# Patient Record
Sex: Male | Born: 1985 | Race: Black or African American | Hispanic: No | Marital: Single | Smoking: Current every day smoker
Health system: Southern US, Community
[De-identification: ages and names within clinical notes are randomized; demographics above are authoritative.]

## PROBLEM LIST (undated history)

## (undated) DIAGNOSIS — F419 Anxiety disorder, unspecified: Secondary | ICD-10-CM

## (undated) HISTORY — DX: Anxiety disorder, unspecified: F41.9

## (undated) HISTORY — PX: NOSE SURGERY: SHX723

---
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
## 2016-11-23 ENCOUNTER — Encounter (HOSPITAL_COMMUNITY): Payer: Self-pay

## 2016-11-23 ENCOUNTER — Emergency Department (HOSPITAL_COMMUNITY)
Admission: EM | Admit: 2016-11-23 | Discharge: 2016-11-23 | Disposition: A | Discharge status: Home / Self Care | Payer: Medicaid Other | Attending: Emergency Medicine | Admitting: Emergency Medicine

## 2016-11-23 DIAGNOSIS — Y929 Unspecified place or not applicable: Secondary | ICD-10-CM | POA: Diagnosis not present

## 2016-11-23 DIAGNOSIS — X58XXXA Exposure to other specified factors, initial encounter: Secondary | ICD-10-CM | POA: Diagnosis not present

## 2016-11-23 DIAGNOSIS — Y939 Activity, unspecified: Secondary | ICD-10-CM | POA: Diagnosis not present

## 2016-11-23 DIAGNOSIS — Y999 Unspecified external cause status: Secondary | ICD-10-CM | POA: Diagnosis not present

## 2016-11-23 DIAGNOSIS — F17210 Nicotine dependence, cigarettes, uncomplicated: Secondary | ICD-10-CM | POA: Insufficient documentation

## 2016-11-23 DIAGNOSIS — K0889 Other specified disorders of teeth and supporting structures: Secondary | ICD-10-CM

## 2016-11-23 DIAGNOSIS — S0993XA Unspecified injury of face, initial encounter: Secondary | ICD-10-CM | POA: Diagnosis present

## 2016-11-23 DIAGNOSIS — S025XXA Fracture of tooth (traumatic), initial encounter for closed fracture: Secondary | ICD-10-CM | POA: Insufficient documentation

## 2016-11-23 MED ORDER — IBUPROFEN 400 MG PO TABS: 400.0000 mg | ORAL_TABLET | Freq: Four times a day (QID) | ORAL | 0 refills | Status: DC | PRN

## 2016-11-23 MED ORDER — PENICILLIN V POTASSIUM 250 MG PO TABS
500.0000 mg | ORAL_TABLET | Freq: Four times a day (QID) | ORAL | Status: DC
  Administered 2016-11-23: 500 mg via ORAL
  Filled 2016-11-23: qty 2

## 2016-11-23 MED ORDER — KETOROLAC TROMETHAMINE 60 MG/2ML IM SOLN
60.0000 mg | Freq: Once | INTRAMUSCULAR | Status: AC
  Administered 2016-11-23: 60 mg via INTRAMUSCULAR
  Filled 2016-11-23: qty 2

## 2016-11-23 MED ORDER — PENICILLIN V POTASSIUM 500 MG PO TABS: 500.0000 mg | ORAL_TABLET | Freq: Three times a day (TID) | ORAL | 0 refills | Status: DC

## 2016-11-23 NOTE — ED Notes (Signed)
Pt ambulatory to waiting room. Pt verbalized understanding of discharge instructions.   

## 2016-11-23 NOTE — ED Provider Notes (Signed)
[facility]-EMERGENCY DEPT Provider Note   CSN: [id number] Arrival date & time: 11/23/16  2148   By signing my name below, I, Clarisse GougeXavier Herndon, attest that this documentation has been prepared under the direction and in the presence of Abdur Hoglund, Barbara CowerJason, MD. Electronically signed, Clarisse GougeXavier Herndon, ED Scribe. 11/23/16. 10:28 PM.   History   Chief Complaint Chief Complaint  Patient presents with  . Dental Pain   The history is provided by the patient, medical records and a friend. No language interpreter was used.    Evan Rice is a 31 y.o. male with h/o poor dentition who presents to the Emergency Department with concern for severe, constant, throbbing L upper dental pain all day today. Pain progressively worsened over the day with no radiation. He states he has had a cavity to the area x 2 years and been unable to get it repaired d/t financial constraints. No modifying factors noted. No treatments reported at home. No other complaints at this time.  History reviewed. No pertinent past medical history.  There are no active problems to display for this patient.   History reviewed. No pertinent surgical history.     Home Medications    Prior to Admission medications   Medication Sig Start Date End Date Taking? Authorizing Provider  ibuprofen (ADVIL,MOTRIN) 400 MG tablet Take 1 tablet (400 mg total) by mouth every 6 (six) hours as needed. 11/23/16   Bodey Frizell, Barbara CowerJason, MD  penicillin v potassium (VEETID) 500 MG tablet Take 1 tablet (500 mg total) by mouth 3 (three) times daily. 11/23/16   Ange Puskas, Barbara CowerJason, MD    Family History No family history on file.  Social History Social History  Substance Use Topics  . Smoking status: Current Every Day Smoker    Packs/day: 1.00    Types: Cigarettes  . Smokeless tobacco: Never Used  . Alcohol use Yes     Comment: (2) 40oz of beer weekly     Allergies   Patient has no known allergies.   Review of Systems Review of Systems All other systems  reviewed and all systems are negative for acute changes except as noted in the HPI and PMH.    Physical Exam Updated Vital Signs BP 138/70 (BP Location: Right Arm)   Pulse (!) 51   Temp 98.4 F (36.9 C) (Oral)   Resp 17   Ht 5\' 6"  (1.676 m)   Wt 220 lb (99.8 kg)   SpO2 97%   BMI 35.51 kg/m   Physical Exam  Constitutional: He is oriented to person, place, and time. He appears well-developed and well-nourished.  HENT:  Head: Normocephalic.  Mouth/Throat: Oropharynx is clear and moist.  Broken tooth L upper canine, erythema and swelling surrounding; no sublingual tenderness or fullness  Eyes: EOM are normal.  Neck: Normal range of motion.  Pulmonary/Chest: Effort normal. No respiratory distress.  Abdominal: He exhibits no distension.  Musculoskeletal: Normal range of motion.  Neurological: He is alert and oriented to person, place, and time.  Skin: No rash noted. No erythema.  Psychiatric: He has a normal mood and affect.  Nursing note and vitals reviewed.    ED Treatments / Results  DIAGNOSTIC STUDIES: Oxygen Saturation is 97% on RA, NL by my interpretation.    COORDINATION OF CARE: 2:57 PM-Discussed next steps with pt. Pt verbalized understanding and is agreeable with the plan. Will order medications. Pt prepared for d/c, advised of symptomatic care at home, F/U instructions and return precautions.    Labs (all labs  ordered are listed, but only abnormal results are displayed) Labs Reviewed - No data to display  EKG  EKG Interpretation None       Radiology No results found.  Procedures Procedures (including critical care time)  Medications Ordered in ED Medications  ketorolac (TORADOL) injection 60 mg (60 mg Intramuscular Given 11/23/16 2246)     Initial Impression / Assessment and Plan / ED Course  I have reviewed the triage vital signs and the nursing notes.  Pertinent labs & imaging results that were available during my care of the patient were  reviewed by me and considered in my medical decision making (see chart for details).     Dental pain. Mild infection. abx/nsaids given. No e/o ludwigs or other deep space infection or systemic involvement.  Needs dental follow up which he is working on.   Final Clinical Impressions(s) / ED Diagnoses   Final diagnoses:  Closed fracture of tooth, initial encounter  Dentalgia    New Prescriptions Discharge Medication List as of 11/23/2016 10:28 PM    START taking these medications   Details  ibuprofen (ADVIL,MOTRIN) 400 MG tablet Take 1 tablet (400 mg total) by mouth every 6 (six) hours as needed., Starting Tue 11/23/2016, Print    penicillin v potassium (VEETID) 500 MG tablet Take 1 tablet (500 mg total) by mouth 3 (three) times daily., Starting Tue 11/23/2016, Print      I personally performed the services described in this documentation, which was scribed in my presence. The recorded information has been reviewed and is accurate.     Marily Memos, MD 11/25/16 [phone]

## 2016-11-23 NOTE — ED Triage Notes (Signed)
Left upper toothache that started this evening.

## 2017-04-03 ENCOUNTER — Emergency Department (HOSPITAL_COMMUNITY)
Admission: EM | Admit: 2017-04-03 | Discharge: 2017-04-03 | Disposition: A | Discharge status: Home / Self Care | Payer: Medicaid Other | Attending: Emergency Medicine | Admitting: Emergency Medicine

## 2017-04-03 ENCOUNTER — Emergency Department (HOSPITAL_COMMUNITY): Payer: Medicaid Other

## 2017-04-03 ENCOUNTER — Encounter (HOSPITAL_COMMUNITY): Payer: Self-pay | Admitting: Emergency Medicine

## 2017-04-03 DIAGNOSIS — F17210 Nicotine dependence, cigarettes, uncomplicated: Secondary | ICD-10-CM | POA: Insufficient documentation

## 2017-04-03 DIAGNOSIS — R0789 Other chest pain: Secondary | ICD-10-CM | POA: Diagnosis not present

## 2017-04-03 DIAGNOSIS — R079 Chest pain, unspecified: Secondary | ICD-10-CM | POA: Diagnosis present

## 2017-04-03 LAB — CBC
HEMATOCRIT: 44.1 % (ref 39.0–52.0)
HEMOGLOBIN: 14.8 g/dL (ref 13.0–17.0)
MCH: 30.1 pg (ref 26.0–34.0)
MCHC: 33.6 g/dL (ref 30.0–36.0)
MCV: 89.6 fL (ref 78.0–100.0)
Platelets: 143 10*3/uL — ABNORMAL LOW (ref 150–400)
RBC: 4.92 MIL/uL (ref 4.22–5.81)
RDW: 13.6 % (ref 11.5–15.5)
WBC: 8.1 10*3/uL (ref 4.0–10.5)

## 2017-04-03 LAB — BASIC METABOLIC PANEL
ANION GAP: 11 (ref 5–15)
BUN: 8 mg/dL (ref 6–20)
CALCIUM: 8.7 mg/dL — AB (ref 8.9–10.3)
CO2: 21 mmol/L — AB (ref 22–32)
CREATININE: 0.89 mg/dL (ref 0.61–1.24)
Chloride: 107 mmol/L (ref 101–111)
GFR calc non Af Amer: >60 mL/min (ref >60)
Glucose, Bld: 91 mg/dL (ref 65–99)
Potassium: 4 mmol/L (ref 3.5–5.1)
SODIUM: 139 mmol/L (ref 135–145)

## 2017-04-03 LAB — TROPONIN I

## 2017-04-03 NOTE — ED Provider Notes (Signed)
[facility]-EMERGENCY DEPT Provider Note   CSN: [id number] Arrival date & time: 04/03/17  0151     History   Chief Complaint Chief Complaint  Patient presents with  . Chest Pain    HPI Evan Rice is a 31 y.o. male.  HPI Patient is a 31 year old male who presents the emergency department with complaints of chest pressure with some shortness of breath.  This resolved for EMS.  No symptoms at this time.  He states he is intermittently had these symptoms for months and is worse at nighttime when he lays flat.  No history of early cardiac disease in his family.  No prior history of heart disease himself.  He is currently unemployed.  He states he spends most of his day sitting around the house watching TV.  He does not exercise.  Denies abdominal pain.  No back pain.  No radiation of his discomfort to his jaw or neck.  No diaphoresis.  No nausea or vomiting.  Symptoms have resolved    History reviewed. No pertinent past medical history.  There are no active problems to display for this patient.   History reviewed. No pertinent surgical history.     Home Medications    Prior to Admission medications   Medication Sig Start Date End Date Taking? Authorizing Provider  ibuprofen (ADVIL,MOTRIN) 400 MG tablet Take 1 tablet (400 mg total) by mouth every 6 (six) hours as needed. 11/23/16   Mesner, Barbara Cower, MD  penicillin v potassium (VEETID) 500 MG tablet Take 1 tablet (500 mg total) by mouth 3 (three) times daily. 11/23/16   Mesner, Barbara Cower, MD    Family History No family history on file.  Social History Social History  Substance Use Topics  . Smoking status: Current Every Day Smoker    Packs/day: 1.00    Types: Cigarettes  . Smokeless tobacco: Never Used  . Alcohol use Yes     Comment: (2) 40oz of beer weekly     Allergies   Patient has no known allergies.   Review of Systems Review of Systems  All other systems reviewed and are negative.    Physical Exam Updated Vital  Signs BP (!) 115/91   Pulse (!) 48   Resp (!) 21   Ht  (1.676 m)   Wt 82.6 kg (182 lb)   SpO2 96%   BMI 29.38 kg/m   Physical Exam  Constitutional: He is oriented to person, place, and time. He appears well-developed and well-nourished.  HENT:  Head: Normocephalic and atraumatic.  Eyes: EOM are normal.  Neck: Normal range of motion.  Cardiovascular: Normal rate, regular rhythm and normal heart sounds.   Pulmonary/Chest: Effort normal and breath sounds normal. No respiratory distress.  Abdominal: Soft. He exhibits no distension. There is no tenderness.  Musculoskeletal: Normal range of motion.  Neurological: He is alert and oriented to person, place, and time.  Skin: Skin is warm and dry.  Psychiatric: He has a normal mood and affect. Judgment normal.  Nursing note and vitals reviewed.    ED Treatments / Results  Labs (all labs ordered are listed, but only abnormal results are displayed) Labs Reviewed  CBC - Abnormal; Notable for the following:       Result Value   Platelets 143 (*)    All other components within normal limits  BASIC METABOLIC PANEL - Abnormal; Notable for the following:    CO2 21 (*)    Calcium 8.7 (*)    All other  components within normal limits  TROPONIN I    EKG  EKG Interpretation  Date/Time:  Sunday April 03 2017 01:54:44 EDT Ventricular Rate:  46 PR Interval:    QRS Duration: 101 QT Interval:  487 QTC Calculation: 426 R Axis:   61 Text Interpretation:  Sinus bradycardia Atrial premature complex No old tracing to compare Benign early repolarization Confirmed by Azalia Bilis ([id number]) on 04/03/2017 3:42:06 AM       Radiology Dg Chest 2 View  Result Date: 04/03/2017 CLINICAL DATA:  Shortness of breath and chest pain EXAM: CHEST  2 VIEW COMPARISON:  None. FINDINGS: Lungs are clear. Heart size and pulmonary vascularity are normal. No adenopathy. No pneumothorax. No bone lesions. IMPRESSION: No edema or consolidation. Electronically  Signed   By: Bretta Bang III M.D.   On: 04/03/2017 02:43    Procedures Procedures (including critical care time)  Medications Ordered in ED Medications - No data to display   Initial Impression / Assessment and Plan / ED Course  I have reviewed the triage vital signs and the nursing notes.  Pertinent labs & imaging results that were available during my care of the patient were reviewed by me and considered in my medical decision making (see chart for details).    Well-appearing.  Atypical chest pain.  Doubt PE.  PERC negative.  Doubt ACS.  Overall well-appearing.  EKG demonstrates early repolarization.  Primary care follow-up.  Patient understands return to the ER for new or worsening symptoms  Final Clinical Impressions(s) / ED Diagnoses   Final diagnoses:  Atypical chest pain    New Prescriptions New Prescriptions   No medications on file     Azalia Bilis, MD 04/03/17 [phone]

## 2017-04-03 NOTE — ED Triage Notes (Signed)
Brought by ems from home after having central chest pressure with SOB.  Given Ntg 0.4 X 1 en route with full relief of pain and sob.  Reports that this has happened several times before. Denies having any history.

## 2017-07-29 ENCOUNTER — Other Ambulatory Visit: Payer: Self-pay

## 2017-07-29 ENCOUNTER — Encounter (HOSPITAL_COMMUNITY): Payer: Self-pay | Admitting: Emergency Medicine

## 2017-07-29 ENCOUNTER — Emergency Department (HOSPITAL_COMMUNITY)
Admission: EM | Admit: 2017-07-29 | Discharge: 2017-07-29 | Disposition: A | Discharge status: Home / Self Care | Payer: Medicaid Other | Attending: Emergency Medicine | Admitting: Emergency Medicine

## 2017-07-29 DIAGNOSIS — F17210 Nicotine dependence, cigarettes, uncomplicated: Secondary | ICD-10-CM | POA: Insufficient documentation

## 2017-07-29 DIAGNOSIS — A630 Anogenital (venereal) warts: Secondary | ICD-10-CM | POA: Insufficient documentation

## 2017-07-29 DIAGNOSIS — R1909 Other intra-abdominal and pelvic swelling, mass and lump: Secondary | ICD-10-CM | POA: Diagnosis present

## 2017-07-29 MED ORDER — LIDOCAINE 5 % EX OINT: 1.0000 "application " | TOPICAL_OINTMENT | CUTANEOUS | 0 refills | Status: DC | PRN

## 2017-07-29 NOTE — Discharge Instructions (Signed)
Please be sure to schedule appointment with our urology colleagues.  Return here for concerning changes in your condition.

## 2017-07-29 NOTE — ED Triage Notes (Signed)
Testicular swelling and knot on testicle for 2-3 years   Getting bigger and now with bleeding and painful  Pt has never had it evaluated

## 2017-07-29 NOTE — ED Provider Notes (Signed)
[facility] EMERGENCY DEPARTMENT Provider Note   CSN: [id number] Arrival date & time: 07/29/17  1444     History   Chief Complaint Chief Complaint  Patient presents with  . Groin Swelling    for 2-3 years    HPI Evan Rice is a 32 y.o. male.  HPI  Presents due to concern of a painful lesion on the left side of his scrotum. Lesions been present for possibly 2 or 3 years, but over the past 2 days has become more irritated, slightly erythematous, with some bloody discharge. No other new systemic complaints including fever, chills, dysuria, hematuria. Patient has had no evaluation of this lesion thus far.   History reviewed. No pertinent past medical history.  There are no active problems to display for this patient.   History reviewed. No pertinent surgical history.     Home Medications    Prior to Admission medications   Medication Sig Start Date End Date Taking? Authorizing Provider  lidocaine (XYLOCAINE) 5 % ointment Apply 1 application topically as needed. 07/29/17   Gerhard Munch, MD    Family History History reviewed. No pertinent family history.  Social History Social History   Tobacco Use  . Smoking status: Current Every Day Smoker    Packs/day: 1.00    Types: Cigarettes  . Smokeless tobacco: Never Used  Substance Use Topics  . Alcohol use: Yes    Comment: (2) 40oz of beer weekly  . Drug use: No     Allergies   Patient has no known allergies.   Review of Systems Review of Systems  Constitutional: Negative for fever.  Respiratory: Negative for shortness of breath.   Cardiovascular: Negative for chest pain.  Musculoskeletal:       Negative aside from HPI  Skin:       Negative aside from HPI  Allergic/Immunologic: Negative for immunocompromised state.  Neurological: Negative for weakness.     Physical Exam Updated Vital Signs BP (!) 141/84 (BP Location: Right Arm)   Pulse 70   Temp 98.4 F (36.9 C) (Oral)   Resp 16    Ht 5\' 6"  (1.676 m)   Wt 82.6 kg (182 lb)   SpO2 99%   BMI 29.38 kg/m   Physical Exam  Constitutional: He is oriented to person, place, and time. He appears well-developed. No distress.  HENT:  Head: Normocephalic and atraumatic.  Eyes: Conjunctivae and EOM are normal.  Pulmonary/Chest: Effort normal. No stridor. No respiratory distress.  Abdominal: He exhibits no distension.  Genitourinary: Testes normal and penis normal. Circumcised.     Musculoskeletal: He exhibits no edema.  Neurological: He is alert and oriented to person, place, and time.  Skin: Skin is warm and dry.  Psychiatric: He has a normal mood and affect.  Nursing note and vitals reviewed.    ED Treatments / Results   Procedures Procedures (including critical care time)  Medications Ordered in ED Medications - No data to display   Initial Impression / Assessment and Plan / ED Course  I have reviewed the triage vital signs and the nursing notes.  Pertinent labs & imaging results that were available during my care of the patient were reviewed by me and considered in my medical decision making (see chart for details).  Generally well-appearing young male presents with concern of a new mass near his scrotum. Patient is awake and alert, no evidence for systemic disease. Findings consistent with condyloma. Absent other complaints, with reassuring vitals, patient will follow  up with urology.  Final Clinical Impressions(s) / ED Diagnoses   Final diagnoses:  Genital warts    ED Discharge Orders        Ordered    lidocaine (XYLOCAINE) 5 % ointment  As needed     07/29/17 1542       Gerhard MunchLockwood, Danniella Robben, MD 07/29/17 1545

## 2018-01-23 ENCOUNTER — Encounter (HOSPITAL_COMMUNITY): Payer: Self-pay | Admitting: *Deleted

## 2018-01-23 ENCOUNTER — Emergency Department (HOSPITAL_COMMUNITY)
Admission: EM | Admit: 2018-01-23 | Discharge: 2018-01-23 | Disposition: A | Discharge status: Home / Self Care | Payer: Medicaid Other | Attending: Emergency Medicine | Admitting: Emergency Medicine

## 2018-01-23 ENCOUNTER — Other Ambulatory Visit: Payer: Self-pay

## 2018-01-23 DIAGNOSIS — Z5321 Procedure and treatment not carried out due to patient leaving prior to being seen by health care provider: Secondary | ICD-10-CM | POA: Insufficient documentation

## 2018-01-23 DIAGNOSIS — R109 Unspecified abdominal pain: Secondary | ICD-10-CM | POA: Insufficient documentation

## 2018-01-23 NOTE — ED Triage Notes (Signed)
Pt c/o abdominal pain x 2 days; pt states he has been vomiting and is constipated last normal BM was x 2 days ago;

## 2018-01-23 NOTE — ED Notes (Signed)
Follow up call made  Pt feeling some better  01/23/18  0840  s Chrishonda Hesch rn

## 2019-02-16 IMAGING — DX DG CHEST 2V
2 series · 2 of 2 positions shown · non-contrast
Comparison: None.

CLINICAL DATA: Shortness of breath and chest pain

EXAM:
CHEST  2 VIEW

[chest pa]
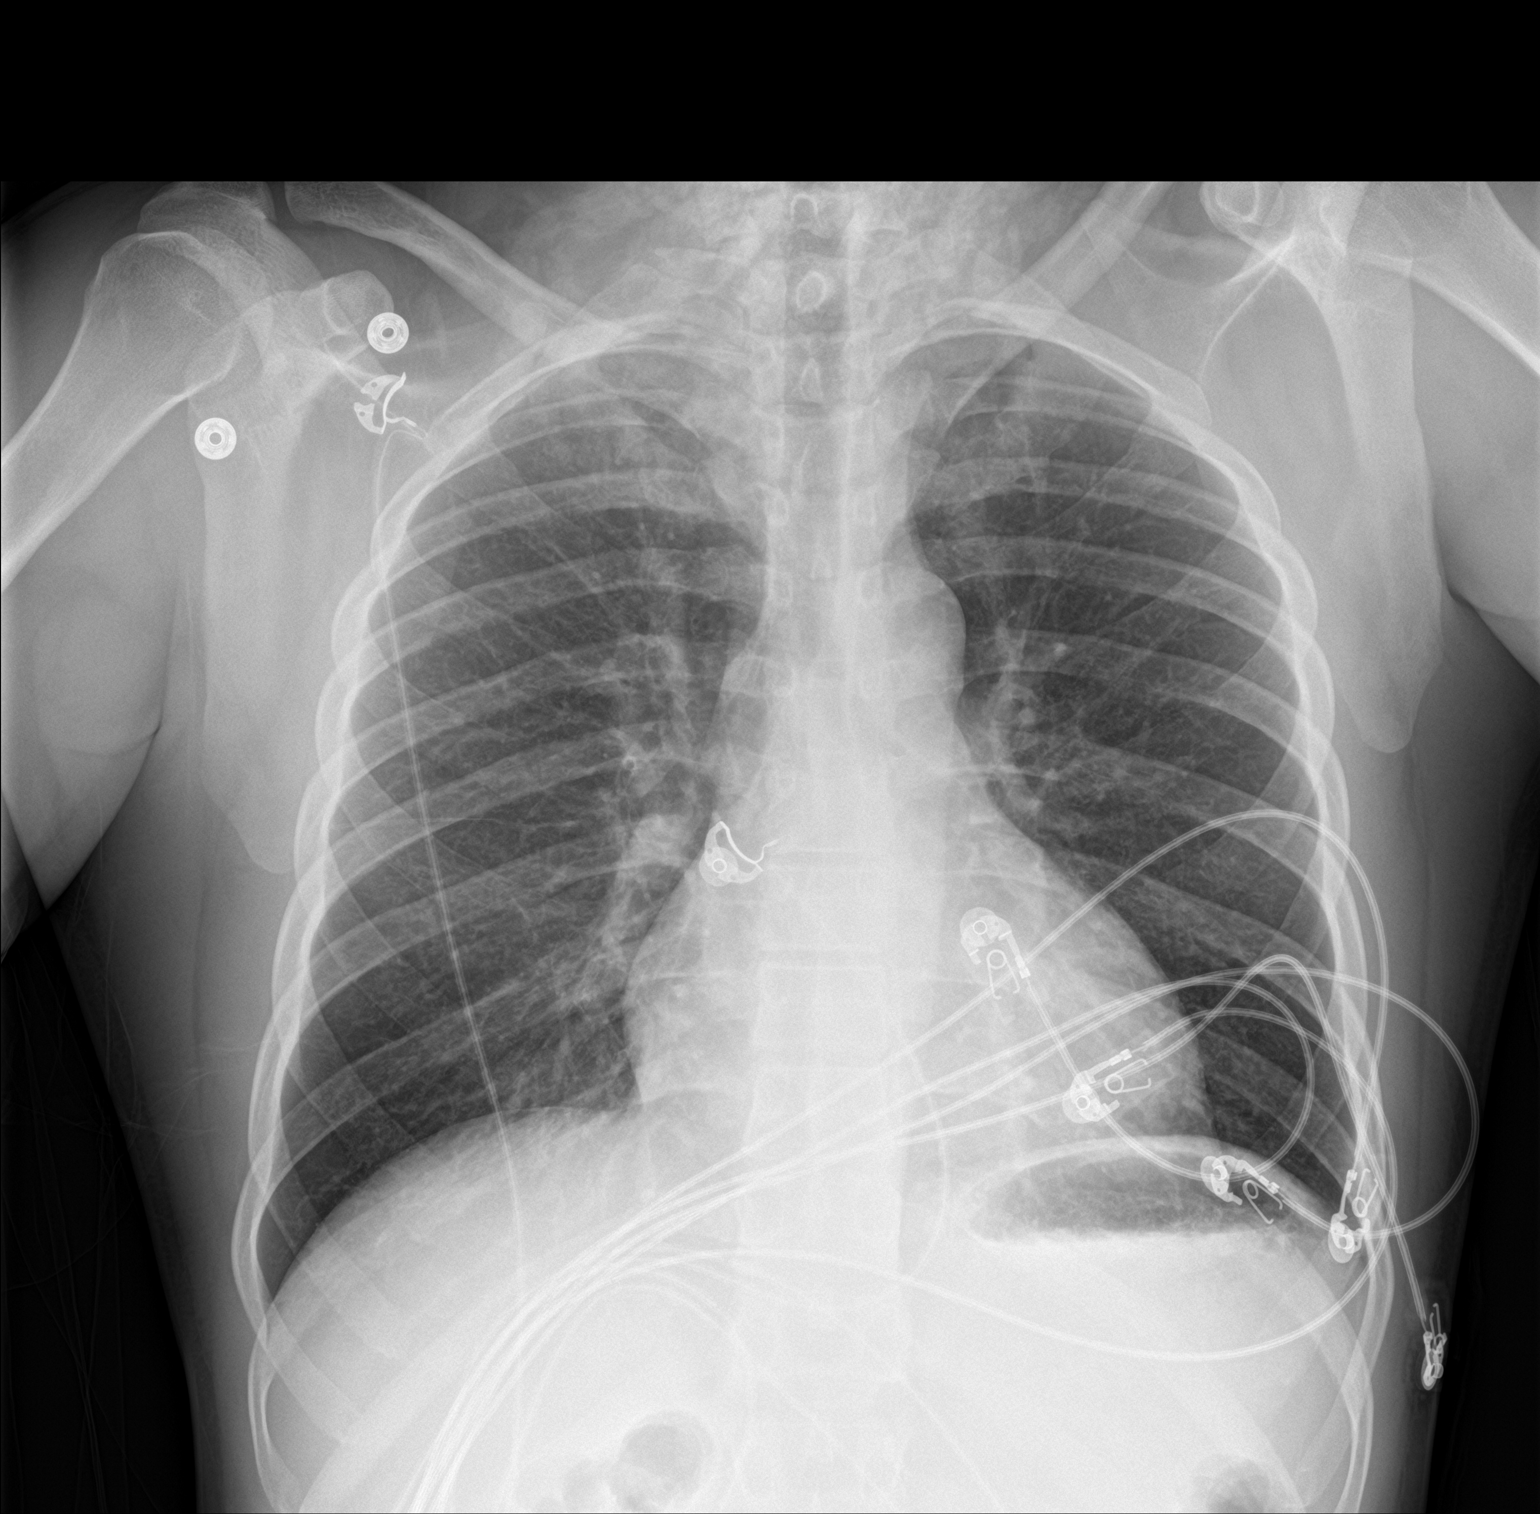

[chest lat]
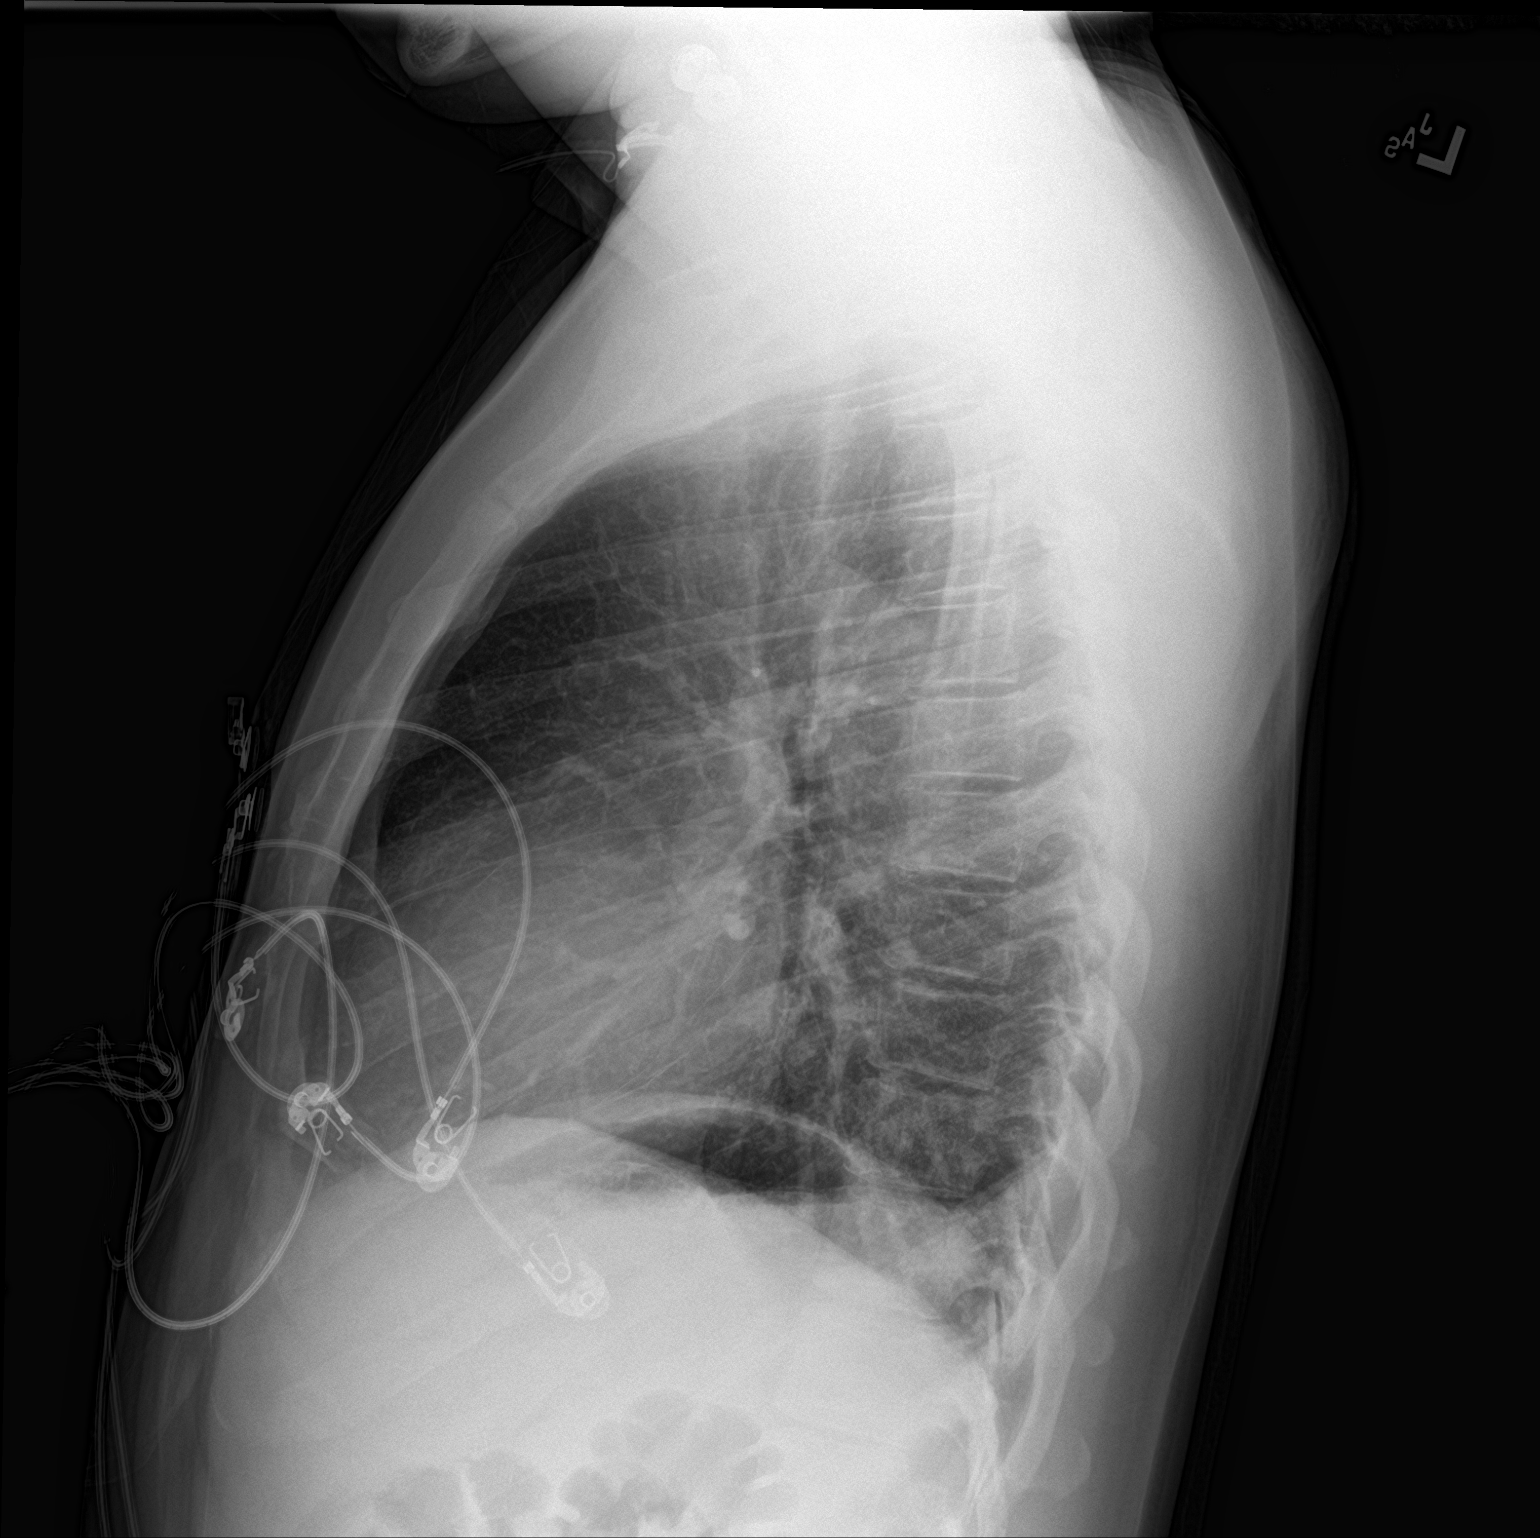

[2 of 2 positions shown; findings below may reference images not displayed]

FINDINGS: Lungs are clear. Heart size and pulmonary vascularity are normal. No
adenopathy. No pneumothorax. No bone lesions.
IMPRESSION: No edema or consolidation.

## 2022-02-06 ENCOUNTER — Encounter (HOSPITAL_COMMUNITY): Payer: Self-pay

## 2022-02-06 ENCOUNTER — Other Ambulatory Visit: Payer: Self-pay

## 2022-02-06 ENCOUNTER — Emergency Department (HOSPITAL_COMMUNITY): Payer: Medicaid Other

## 2022-02-06 ENCOUNTER — Emergency Department (HOSPITAL_COMMUNITY)
Admission: EM | Admit: 2022-02-06 | Discharge: 2022-02-06 | Disposition: A | Discharge status: Home / Self Care | Payer: Medicaid Other | Attending: Emergency Medicine | Admitting: Emergency Medicine

## 2022-02-06 DIAGNOSIS — R002 Palpitations: Secondary | ICD-10-CM | POA: Insufficient documentation

## 2022-02-06 DIAGNOSIS — Z20822 Contact with and (suspected) exposure to covid-19: Secondary | ICD-10-CM | POA: Diagnosis not present

## 2022-02-06 LAB — TROPONIN I (HIGH SENSITIVITY)
Troponin I (High Sensitivity): 3 ng/L (ref <18)
Troponin I (High Sensitivity): 3 ng/L (ref <18)

## 2022-02-06 LAB — CBC WITH DIFFERENTIAL/PLATELET
Abs Immature Granulocytes: 0 10*3/uL (ref 0.00–0.07)
Basophils Absolute: 0 10*3/uL (ref 0.0–0.1)
Basophils Relative: 0 %
Eosinophils Absolute: 0.2 10*3/uL (ref 0.0–0.5)
Eosinophils Relative: 3 %
HCT: 46.8 % (ref 39.0–52.0)
Hemoglobin: 15.2 g/dL (ref 13.0–17.0)
Immature Granulocytes: 0 %
Lymphocytes Relative: 33 %
Lymphs Abs: 1.8 10*3/uL (ref 0.7–4.0)
MCH: 28.7 pg (ref 26.0–34.0)
MCHC: 32.5 g/dL (ref 30.0–36.0)
MCV: 88.5 fL (ref 80.0–100.0)
Monocytes Absolute: 0.5 10*3/uL (ref 0.1–1.0)
Monocytes Relative: 9 %
Neutro Abs: 2.9 10*3/uL (ref 1.7–7.7)
Neutrophils Relative %: 55 %
Platelets: 140 10*3/uL — ABNORMAL LOW (ref 150–400)
RBC: 5.29 MIL/uL (ref 4.22–5.81)
RDW: 13.3 % (ref 11.5–15.5)
WBC: 5.3 10*3/uL (ref 4.0–10.5)
nRBC: 0 % (ref 0.0–0.2)

## 2022-02-06 LAB — BASIC METABOLIC PANEL
Anion gap: 5 (ref 5–15)
BUN: 16 mg/dL (ref 6–20)
CO2: 27 mmol/L (ref 22–32)
Calcium: 9.6 mg/dL (ref 8.9–10.3)
Chloride: 110 mmol/L (ref 98–111)
Creatinine, Ser: 0.86 mg/dL (ref 0.61–1.24)
GFR, Estimated: >60 mL/min (ref >60)
Glucose, Bld: 89 mg/dL (ref 70–99)
Potassium: 3.8 mmol/L (ref 3.5–5.1)
Sodium: 142 mmol/L (ref 135–145)

## 2022-02-06 LAB — RESP PANEL BY RT-PCR (FLU A&B, COVID) ARPGX2
Influenza A by PCR: NEGATIVE
Influenza B by PCR: NEGATIVE
SARS Coronavirus 2 by RT PCR: NEGATIVE

## 2022-02-06 NOTE — ED Triage Notes (Signed)
Patient states he thinks he has been suffering from anxiety and his doctor told him he had a irregular heartbeat but would not give him more information. Patient denies CP.

## 2022-02-06 NOTE — Discharge Instructions (Signed)
You may be having palpitations which are irregular heartbeats.  Your work-up today overall was reassuring.  I recommend that you call the cardiologist group listed to arrange for a follow-up appointment as you may need Holter monitor testing to further evaluate your palpitations.  Return to the emergency department for any new or worsening symptoms.

## 2022-02-06 NOTE — ED Provider Notes (Signed)
[facility] EMERGENCY DEPARTMENT Provider Note   CSN: [id number] Arrival date & time: 02/06/22  5176     History {Add pertinent medical, surgical, social history, OB history to HPI:1} Chief Complaint  Patient presents with   Irregular Heart Beat    Evan Rice is a 36 y.o. male.  HPI     Evan Rice is a 36 y.o. male who presents to the Emergency Department requesting evaluation for palpitations.  States he was recently admitted to a behavioral health facility and during his stay there he states other facility members would play the TV loudly and talking loudly which caused him increased anxiety he was feeling pains of his chest and irregular heartbeats.  He is now residing at home, but states that they are trying to send him back to the facility.  He is concerned this is going to increase his anxiety again.  States he was released previously from the facility due to irregular heartbeat, but he does not know any further information about this.  He also notes having some occasional cough, head congestion, sore throat, intermittent chills and sweats.  No known COVID exposures.  Denies any recent drug use, no chest pain at present.   Home Medications Prior to Admission medications   Medication Sig Start Date End Date Taking? Authorizing Provider  lidocaine (XYLOCAINE) 5 % ointment Apply 1 application topically as needed. 07/29/17   Gerhard Munch, MD      Allergies    Patient has no known allergies.    Review of Systems   Review of Systems  Constitutional:  Positive for chills. Negative for appetite change.  HENT:  Positive for congestion and sore throat. Negative for trouble swallowing.   Respiratory:  Positive for cough. Negative for shortness of breath.   Cardiovascular:  Positive for chest pain and palpitations.  Gastrointestinal:  Negative for diarrhea, nausea and vomiting.  Musculoskeletal:  Negative for arthralgias and neck pain.  Neurological:  Negative for  dizziness, syncope, weakness and headaches.    Physical Exam Updated Vital Signs BP 131/77   Pulse 73   Temp 97.9 F (36.6 C) (Oral)   Resp 20   Ht 5\' 6"  (1.676 m)   Wt 76.2 kg   SpO2 97%   BMI 27.12 kg/m  Physical Exam Vitals and nursing note reviewed.  Constitutional:      General: He is not in acute distress.    Appearance: Normal appearance. He is not toxic-appearing.  HENT:     Right Ear: Tympanic membrane and ear canal normal.     Left Ear: Tympanic membrane and ear canal normal.     Mouth/Throat:     Mouth: Mucous membranes are moist.     Pharynx: Oropharynx is clear.  Eyes:     Extraocular Movements: Extraocular movements intact.     Pupils: Pupils are equal, round, and reactive to light.  Cardiovascular:     Rate and Rhythm: Normal rate and regular rhythm.     Pulses: Normal pulses.  Pulmonary:     Effort: Pulmonary effort is normal. No respiratory distress.  Abdominal:     General: There is no distension.     Palpations: Abdomen is soft.     Tenderness: There is no abdominal tenderness.  Musculoskeletal:        General: Normal range of motion.     Cervical back: Normal range of motion.     Right lower leg: No edema.     Left lower leg:  No edema.  Lymphadenopathy:     Cervical: No cervical adenopathy.  Skin:    General: Skin is warm.     Capillary Refill: Capillary refill takes less than 2 seconds.     Findings: No rash.  Neurological:     General: No focal deficit present.     Mental Status: He is alert.     Sensory: No sensory deficit.     Motor: No weakness.     ED Results / Procedures / Treatments   Labs (all labs ordered are listed, but only abnormal results are displayed) Labs Reviewed - No data to display  EKG EKG Interpretation  Date/Time:  Saturday February 06 2022 09:57:42 EDT Ventricular Rate:  81 PR Interval:  134 QRS Duration: 87 QT Interval:  386 QTC Calculation: 448 R Axis:   74 Text Interpretation: Sinus arrhythmia Confirmed  by Pricilla Loveless [phone]) on 02/06/2022 9:59:40 AM  Radiology DG Chest 2 View  Result Date: 02/06/2022 CLINICAL DATA:  Chest pain and cough. EXAM: CHEST - 2 VIEW COMPARISON:  04/03/2017 FINDINGS: Heart size and mediastinal contours appear normal. No pleural effusion or edema identified. No airspace opacities. The visualized osseous structures are unremarkable. IMPRESSION: No active cardiopulmonary disease. Electronically Signed   By: Signa Kell M.D.   On: 02/06/2022 11:08    Procedures Procedures  {Document cardiac monitor, telemetry assessment procedure when appropriate:1}  Medications Ordered in ED Medications - No data to display  ED Course/ Medical Decision Making/ A&P                           Medical Decision Making    {Document critical care time when appropriate:1} {Document review of labs and clinical decision tools ie heart score, Chads2Vasc2 etc:1}  {Document your independent review of radiology images, and any outside records:1} {Document your discussion with family members, caretakers, and with consultants:1} {Document social determinants of health affecting pt's care:1} {Document your decision making why or why not admission, treatments were needed:1} Final Clinical Impression(s) / ED Diagnoses Final diagnoses:  None    Rx / DC Orders ED Discharge Orders     None

## 2022-02-23 ENCOUNTER — Encounter: Payer: Self-pay | Admitting: Family Medicine

## 2022-02-23 ENCOUNTER — Telehealth: Payer: Self-pay

## 2022-02-23 ENCOUNTER — Ambulatory Visit: Payer: Medicaid Other | Admitting: Family Medicine

## 2022-02-23 VITALS — BP 121/73 | HR 52 | Ht 68.5 in | Wt 171.2 lb

## 2022-02-23 DIAGNOSIS — Z0001 Encounter for general adult medical examination with abnormal findings: Secondary | ICD-10-CM

## 2022-02-23 DIAGNOSIS — R7301 Impaired fasting glucose: Secondary | ICD-10-CM

## 2022-02-23 DIAGNOSIS — F419 Anxiety disorder, unspecified: Secondary | ICD-10-CM

## 2022-02-23 DIAGNOSIS — Z114 Encounter for screening for human immunodeficiency virus [HIV]: Secondary | ICD-10-CM

## 2022-02-23 DIAGNOSIS — I499 Cardiac arrhythmia, unspecified: Secondary | ICD-10-CM | POA: Insufficient documentation

## 2022-02-23 DIAGNOSIS — I498 Other specified cardiac arrhythmias: Secondary | ICD-10-CM

## 2022-02-23 DIAGNOSIS — R002 Palpitations: Secondary | ICD-10-CM

## 2022-02-23 DIAGNOSIS — E559 Vitamin D deficiency, unspecified: Secondary | ICD-10-CM | POA: Diagnosis not present

## 2022-02-23 DIAGNOSIS — Z1159 Encounter for screening for other viral diseases: Secondary | ICD-10-CM

## 2022-02-23 DIAGNOSIS — R52 Pain, unspecified: Secondary | ICD-10-CM

## 2022-02-23 MED ORDER — SERTRALINE HCL 25 MG PO TABS: 25.0000 mg | ORAL_TABLET | Freq: Every day | ORAL | 3 refills | Status: AC

## 2022-02-23 MED ORDER — ACETAMINOPHEN ER 650 MG PO TBCR: 650.0000 mg | EXTENDED_RELEASE_TABLET | Freq: Three times a day (TID) | ORAL | 1 refills | Status: AC | PRN

## 2022-02-23 NOTE — Patient Instructions (Addendum)
I appreciate the opportunity to provide care to you today!    Follow up:  1 months  Labs: please stop by the lab during the week  to get your blood drawn (CBC, CMP, TSH, Lipid profile, HgA1c, Vit D)  Screening: HIV and Hep C  Please pick up your medication at the pharmacy  Take Zoloft at the same time each day with or without food It may take at least 2 weeks for benefit, and 6-8 weeks for full effect Do not abruptly discontinue to avoid withdrawal symptoms   Referrals today- cardiology   Please continue to a heart-healthy diet and increase your physical activities. Try to exercise for at least three times a week.      It was a pleasure to see you and I look forward to continuing to work together on your health and well-being. Please do not hesitate to call the office if you need care or have questions about your care.   Have a wonderful day and week. With Gratitude, Gilmore Laroche MSN, FNP-BC

## 2022-02-23 NOTE — Progress Notes (Signed)
New Patient Office Visit  Subjective:  Patient ID: Evan Rice, male    DOB: 10/16/1985  Age: 36 y.o. MRN: [id number]  CC:  Chief Complaint  Patient presents with   New Patient (Initial Visit)    Pt establishing care, c/o palpitations was previously in behavioral health facility d/c due to social anxiety was seen in ED on 07/22.     HPI Evan Rice is a 36 y.o. male with past medical history of anxiety presents for establishing care. Palpitations: was seen in the ED on 02/06/22 for palpitation. EKG showed sinus arrhythmia with an unremarkable chest x-ray. He was encouraged to follow up with cardiology. Today he c/o of palpitations but denies chest tightness, pain, and SOB.  Anxiety: onset of symptoms since age 58 with no medication taken. He reports worsening symptoms while in the behavioral health facility. He is currently discharged and stays at home and wants to be stated on antianxiety medications.    History reviewed. No pertinent past medical history.  History reviewed. No pertinent surgical history.  History reviewed. No pertinent family history.  Social History   Socioeconomic History   Marital status: Single    Spouse name: Not on file   Number of children: Not on file   Years of education: Not on file   Highest education level: Not on file  Occupational History   Not on file  Tobacco Use   Smoking status: Every Day    Packs/day: 1.00    Types: Cigarettes   Smokeless tobacco: Never  Substance and Sexual Activity   Alcohol use: Yes    Comment: (2) 40oz of beer weekly   Drug use: No   Sexual activity: Not on file  Other Topics Concern   Not on file  Social History Narrative   Not on file   Social Determinants of Health   Financial Resource Strain: Not on file  Food Insecurity: Not on file  Transportation Needs: Not on file  Physical Activity: Not on file  Stress: Not on file  Social Connections: Not on file  Intimate Partner Violence: Not  on file    ROS Review of Systems  Constitutional:  Negative for chills and fever.  HENT:  Negative for sinus pressure, sinus pain and sore throat.   Eyes:  Negative for photophobia and redness.  Respiratory:  Negative for chest tightness and shortness of breath.   Cardiovascular:  Positive for palpitations. Negative for chest pain.  Gastrointestinal:  Negative for diarrhea, nausea and vomiting.  Endocrine: Negative for polydipsia, polyphagia and polyuria.  Genitourinary:  Negative for frequency and urgency.  Musculoskeletal:  Negative for gait problem and neck pain.  Neurological:  Negative for dizziness, weakness and headaches.  Psychiatric/Behavioral:  Negative for self-injury and suicidal ideas.     Objective:   Today's Vitals: BP 121/73   Pulse (!) 52   Ht 5' 8.5" (1.74 m)   Wt 171 lb 3.2 oz (77.7 kg)   SpO2 96%   BMI 25.65 kg/m   Physical Exam HENT:     Head: Normocephalic.     Right Ear: External ear normal.     Left Ear: External ear normal.     Nose: No congestion.  Eyes:     Extraocular Movements: Extraocular movements intact.     Pupils: Pupils are equal, round, and reactive to light.  Cardiovascular:     Rate and Rhythm: Normal rate and regular rhythm.     Pulses: Normal pulses.  Heart sounds: Normal heart sounds.  Pulmonary:     Breath sounds: Normal breath sounds.  Abdominal:     Palpations: Abdomen is soft.  Musculoskeletal:     Cervical back: No rigidity.     Right lower leg: No edema.     Left lower leg: No edema.  Skin:    Findings: No lesion or rash.  Neurological:     Mental Status: He is alert and oriented to person, place, and time.     Assessment & Plan:   Problem List Items Addressed This Visit       Cardiovascular and Mediastinum   Cardiac arrhythmia    EKG in clinic today shows sinus bradycardia EKG in the ED on 02/06/22 shows sinus arrhythmia Referral placed to cardiology for further evaluation      Relevant Orders    Ambulatory referral to Cardiology     Other   Anxiety    -Onset of symptoms since age 48 with no medication taken -He reports worsening symptoms while in the behavioral health facility -He is currently discharged and stays at home and wants to be started on antianxiety medications -Will start the patient on Zoloft -Inform patient to take Zoloft at the same time each day with or without food -Informed pt that It may take at least 2 weeks for benefit, and 6-8 weeks for full effect -Informed patient not to abruptly discontinue to avoid withdrawal symptoms       Relevant Medications   sertraline (ZOLOFT) 25 MG tablet   Other Visit Diagnoses     Palpitation    -  Primary   Relevant Orders   EKG 12-Lead (Completed)   Vitamin D deficiency       Relevant Orders   Vitamin D (25 hydroxy)   IFG (impaired fasting glucose)       Relevant Orders   Hemoglobin A1C   Encounter for general adult medical examination with abnormal findings       Relevant Orders   CBC with Differential/Platelet   CMP14+EGFR   TSH + free T4   Lipid panel   Encounter for screening for HIV       Relevant Orders   HIV antibody (with reflex)   Need for hepatitis C screening test       Relevant Orders   Hepatitis C Antibody   Pain           Outpatient Encounter Medications as of 02/23/2022  Medication Sig   acetaminophen (TYLENOL 8 HOUR) 650 MG CR tablet Take 1 tablet (650 mg total) by mouth every 8 (eight) hours as needed for pain.   sertraline (ZOLOFT) 25 MG tablet Take 1 tablet (25 mg total) by mouth daily.   No facility-administered encounter medications on file as of 02/23/2022.    Follow-up: Return in about 1 month (around 03/26/2022) for anxiety.   Alvira Monday, FNP

## 2022-02-23 NOTE — Progress Notes (Signed)
Dont sent a prescription ibuprofen. I've orderd him tylenol instead

## 2022-02-23 NOTE — Assessment & Plan Note (Signed)
EKG in clinic today shows sinus bradycardia EKG in the ED on 02/06/22 shows sinus arrhythmia Referral placed to cardiology for further evaluation

## 2022-02-23 NOTE — Telephone Encounter (Signed)
Patient called said [facility] has not yet received sertraline (ZOLOFT) 25 MG tablet   AND  When patient was at ER for his EKG prescribe him with Ibuprofen but never received none.  Patient asking can Malachi Bonds give a prescription Ibuprofen.  Pharmacy: [facility]

## 2022-02-23 NOTE — Telephone Encounter (Signed)
Please advise 

## 2022-02-23 NOTE — Assessment & Plan Note (Signed)
-  Onset of symptoms since age 36 with no medication taken -He reports worsening symptoms while in the behavioral health facility -He is currently discharged and stays at home and wants to be started on antianxiety medications -Will start the patient on Zoloft -Inform patient to take Zoloft at the same time each day with or without food -Informed pt that It may take at least 2 weeks for benefit, and 6-8 weeks for full effect -Informed patient not to abruptly discontinue to avoid withdrawal symptoms

## 2022-02-23 NOTE — Telephone Encounter (Signed)
Yes, he can have ibuprofen

## 2022-02-23 NOTE — Progress Notes (Signed)
New

## 2022-02-24 NOTE — Telephone Encounter (Signed)
Spoke with pt advised tylenol and zoloft was sent to pharmacy pt verbalized understanding

## 2022-03-26 ENCOUNTER — Ambulatory Visit: Payer: Medicaid Other | Admitting: Family Medicine

## 2022-04-07 ENCOUNTER — Encounter: Payer: Self-pay | Admitting: Cardiology

## 2022-04-07 NOTE — Progress Notes (Unsigned)
Cardiology Office Note  Date: 04/08/2022   ID: Evan Rice, DOB [birth year], MRN [id number]  PCP:  Alvira Monday, [place]  Cardiologist:  Rozann Lesches, MD Electrophysiologist:  None   Chief Complaint  Patient presents with   Palpitations    History of Present Illness: Evan Rice is a 36 y.o. male referred for cardiology consultation by Ms. Louanna Raw NP for the evaluation of palpitations.  I reviewed the available records.  He just recently established care with PCP office.  He is here today with his wife.  He tells me that 2 months ago he was staying in a rehabilitation facility recommended by his probation officer.  During that time he was feeling episodes of anxiety and agoraphobia during which she felt a sense of rapid heartbeat and "things closing in."  No chest pain or syncope.  Since being out of that facility he has not had any further events ,although does mention that he feels anxious when he is around strangers.  He has no standing history of hypertension or cardiac arrhythmia.  No obvious family history of arrhythmia.  I reviewed available tracings which show sinus bradycardia with early repolarization pattern, borderline increased voltage and normal intervals.  He is currently not taking any regular medications.  Past Medical History:  Diagnosis Date   Anxiety     Past Surgical History:  Procedure Laterality Date   NOSE SURGERY      Current Outpatient Medications  Medication Sig Dispense Refill   acetaminophen (TYLENOL 8 HOUR) 650 MG CR tablet Take 1 tablet (650 mg total) by mouth every 8 (eight) hours as needed for pain. (Patient not taking: Reported on 04/08/2022) 60 tablet 1   sertraline (ZOLOFT) 25 MG tablet Take 1 tablet (25 mg total) by mouth daily. (Patient not taking: Reported on 04/08/2022) 30 tablet 3   No current facility-administered medications for this visit.   Allergies:  Patient has no known allergies.   Social History: The patient   reports that he has been smoking cigarettes. He has been smoking an average of 1 pack per day. He has never been exposed to tobacco smoke. He has never used smokeless tobacco. He reports current alcohol use. He reports that he does not use drugs.   Family History: The patient's family history includes Cancer in his maternal grandmother.   ROS: No unexplained syncope.  Physical Exam: VS:  BP 116/80 (BP Location: Right Arm, Patient Position: Sitting, Cuff Size: Normal)   Pulse (!) 49   Ht 5' 8.5" (1.74 m)   Wt 171 lb 3.2 oz (77.7 kg)   SpO2 94%   BMI 25.65 kg/m , BMI Body mass index is 25.65 kg/m.  Wt Readings from Last 3 Encounters:  04/08/22 171 lb 3.2 oz (77.7 kg)  02/23/22 171 lb 3.2 oz (77.7 kg)  02/06/22 168 lb (76.2 kg)    General: Patient appears comfortable at rest. HEENT: Conjunctiva and lids normal. Neck: Supple, no elevated JVP or carotid bruits. Lungs: Clear to auscultation, nonlabored breathing at rest. Cardiac: Regular rate and rhythm, no S3 or significant systolic murmur, no pericardial rub. Abdomen: Soft, nontender, bowel sounds present. Extremities: No pitting edema, distal pulses 2+. Skin: Warm and dry. Musculoskeletal: No kyphosis. Neuropsychiatric: Alert and oriented x3, affect grossly appropriate.  ECG:  An ECG dated 02/23/2022 was personally reviewed today and demonstrated:  Sinus bradycardia with normal intervals, early repolarization and nonspecific T wave changes.  Recent Labwork: 02/06/2022: BUN 16; Creatinine, Ser 0.86; Hemoglobin 15.2;  Platelets 140; Potassium 3.8; Sodium 142   Other Studies Reviewed Today:  Chest x-ray 02/06/2022: FINDINGS: Heart size and mediastinal contours appear normal. No pleural effusion or edema identified. No airspace opacities. The visualized osseous structures are unremarkable.   IMPRESSION: No active cardiopulmonary disease.  Assessment and Plan:  Sense of palpitations as discussed above, noted in association with  anxiety and agoraphobia that were situational.  He has no history of cardiac arrhythmia and his serial tracings available shows sinus bradycardia with early repolarization and normal intervals.  He has had no unexplained syncope.  No pathologic murmur on examination.  I reviewed his recent lab work.  We will plan a 7-day Zio patch for surveillance.  Medication Adjustments/Labs and Tests Ordered: Current medicines are reviewed at length with the patient today.  Concerns regarding medicines are outlined above.   Tests Ordered: Orders Placed This Encounter  Procedures   EKG 12-Lead    Medication Changes: No orders of the defined types were placed in this encounter.   Disposition:  Follow up  test results.  Signed, Satira Sark, MD, [facility] 04/08/2022 9:30 AM    [facility] [place], [place], [place] [address] Phone: [phone]; Fax: [phone]

## 2022-04-08 ENCOUNTER — Other Ambulatory Visit: Payer: Self-pay | Admitting: Cardiology

## 2022-04-08 ENCOUNTER — Ambulatory Visit: Payer: Medicaid Other | Attending: Cardiology | Admitting: Cardiology

## 2022-04-08 ENCOUNTER — Ambulatory Visit (INDEPENDENT_AMBULATORY_CARE_PROVIDER_SITE_OTHER): Payer: Medicaid Other

## 2022-04-08 ENCOUNTER — Encounter: Payer: Self-pay | Admitting: Cardiology

## 2022-04-08 ENCOUNTER — Telehealth: Payer: Self-pay | Admitting: Cardiology

## 2022-04-08 VITALS — BP 116/80 | HR 49 | Ht 68.5 in | Wt 171.2 lb

## 2022-04-08 DIAGNOSIS — R002 Palpitations: Secondary | ICD-10-CM

## 2022-04-08 NOTE — Patient Instructions (Addendum)
Medication Instructions:  Your physician recommends that you continue on your current medications as directed. Please refer to the Current Medication list given to you today.  Labwork: none  Testing/Procedures: Your physician has recommended that you wear a Zio monitor.   This monitor is a medical device that records the heart's electrical activity. Doctors most often use these monitors to diagnose arrhythmias. Arrhythmias are problems with the speed or rhythm of the heartbeat. The monitor is a small device applied to your chest. You can wear one while you do your normal daily activities. While wearing this monitor if you have any symptoms to push the button and record what you felt. Once you have worn this monitor for the period of time provider prescribed (for 7 days), you will return the monitor device in the postage paid box. Once it is returned they will download the data collected and provide us with a report which the provider will then review and we will call you with those results. Important tips:  Avoid showering during the first 24 hours of wearing the monitor. Avoid excessive sweating to help maximize wear time. Do not submerge the device, no hot tubs, and no swimming pools. Keep any lotions or oils away from the patch. After 24 hours you may shower with the patch on. Take brief showers with your back facing the shower head.  Do not remove patch once it has been placed because that will interrupt data and decrease adhesive wear time. Push the button when you have any symptoms and write down what you were feeling. Once you have completed wearing your monitor, remove and place into box which has postage paid and place in your outgoing mailbox.  If for some reason you have misplaced your box then call our office and we can provide another box and/or mail it off for you.  Follow-Up: Your physician recommends that you schedule a follow-up appointment in: pending  Any Other Special  Instructions Will Be Listed Below (If Applicable).  If you need a refill on your cardiac medications before your next appointment, please call your pharmacy. 

## 2022-04-08 NOTE — Telephone Encounter (Signed)
7 DAY ZIO XT dx: palpitations
